# Patient Record
Sex: Male | Born: 1950 | Race: White | Hispanic: No | Marital: Single
Health system: Southern US, Community
[De-identification: ages and names within clinical notes are randomized; demographics above are authoritative.]

---

## 2010-12-14 ENCOUNTER — Encounter: Payer: Self-pay | Admitting: Internal Medicine

## 2011-01-11 ENCOUNTER — Encounter: Payer: Self-pay | Admitting: Internal Medicine

## 2011-02-10 ENCOUNTER — Encounter: Payer: Self-pay | Admitting: Internal Medicine

## 2011-03-13 ENCOUNTER — Encounter: Payer: Self-pay | Admitting: Internal Medicine

## 2011-04-13 ENCOUNTER — Encounter: Payer: Self-pay | Admitting: Internal Medicine

## 2012-10-19 ENCOUNTER — Ambulatory Visit: Payer: Self-pay | Admitting: General Practice

## 2012-10-19 DIAGNOSIS — Z01810 Encounter for preprocedural cardiovascular examination: Secondary | ICD-10-CM

## 2012-10-19 LAB — BASIC METABOLIC PANEL
Anion Gap: 5 — ABNORMAL LOW (ref 7–16)
BUN: 17 mg/dL (ref 7–18)
Calcium, Total: 9 mg/dL (ref 8.5–10.1)
Co2: 30 mmol/L (ref 21–32)
Creatinine: 1.24 mg/dL (ref 0.60–1.30)
EGFR (Non-African Amer.): >60
Glucose: 116 mg/dL — ABNORMAL HIGH (ref 65–99)
Potassium: 3.8 mmol/L (ref 3.5–5.1)
Sodium: 140 mmol/L (ref 136–145)

## 2012-10-19 LAB — URINALYSIS, COMPLETE
Bacteria: NONE SEEN
Blood: NEGATIVE
Glucose,UR: NEGATIVE mg/dL (ref 0–75)
Ketone: NEGATIVE
Leukocyte Esterase: NEGATIVE
Nitrite: NEGATIVE
Ph: 7 (ref 4.5–8.0)
Protein: NEGATIVE
Specific Gravity: 1.003 (ref 1.003–1.030)
WBC UR: 1 /HPF (ref 0–5)

## 2012-10-19 LAB — SEDIMENTATION RATE: Erythrocyte Sed Rate: 6 mm/hr (ref 0–20)

## 2012-10-19 LAB — CBC
HCT: 41.9 % (ref 40.0–52.0)
MCH: 31.5 pg (ref 26.0–34.0)
MCHC: 32 g/dL (ref 32.0–36.0)
MCV: 98 fL (ref 80–100)
WBC: 8.4 10*3/uL (ref 3.8–10.6)

## 2012-10-19 LAB — APTT: Activated PTT: 29.4 secs (ref 23.6–35.9)

## 2012-10-19 LAB — PROTIME-INR: Prothrombin Time: 13.2 secs (ref 11.5–14.7)

## 2012-10-21 LAB — URINE CULTURE

## 2012-11-02 ENCOUNTER — Inpatient Hospital Stay: Payer: Self-pay | Admitting: General Practice

## 2012-11-03 LAB — HEMOGLOBIN: HGB: 13.3 g/dL (ref 13.0–18.0)

## 2012-11-03 LAB — BASIC METABOLIC PANEL
Calcium, Total: 8.3 mg/dL — ABNORMAL LOW (ref 8.5–10.1)
EGFR (African American): >60
EGFR (Non-African Amer.): 59 — ABNORMAL LOW
Osmolality: 282 (ref 275–301)
Potassium: 4.5 mmol/L (ref 3.5–5.1)
Sodium: 141 mmol/L (ref 136–145)

## 2012-11-04 LAB — BASIC METABOLIC PANEL
Anion Gap: 2 — ABNORMAL LOW (ref 7–16)
Chloride: 116 mmol/L — ABNORMAL HIGH (ref 98–107)
Co2: 29 mmol/L (ref 21–32)
Creatinine: 1.06 mg/dL (ref 0.60–1.30)
EGFR (African American): >60
Glucose: 130 mg/dL — ABNORMAL HIGH (ref 65–99)
Osmolality: 293 (ref 275–301)
Potassium: 4.2 mmol/L (ref 3.5–5.1)
Sodium: 147 mmol/L — ABNORMAL HIGH (ref 136–145)

## 2012-11-05 ENCOUNTER — Encounter: Payer: Self-pay | Admitting: Internal Medicine

## 2012-11-10 ENCOUNTER — Encounter: Payer: Self-pay | Admitting: Internal Medicine

## 2012-11-23 ENCOUNTER — Encounter: Payer: Self-pay | Admitting: General Practice

## 2012-12-10 ENCOUNTER — Encounter: Payer: Self-pay | Admitting: Internal Medicine

## 2012-12-10 ENCOUNTER — Encounter: Payer: Self-pay | Admitting: General Practice

## 2014-02-25 IMAGING — CR DG KNEE 1-2V*L*
1 series · 2 of 2 positions shown · non-contrast
Comparison: none

REASON FOR EXAM: postop
COMMENTS:   Bedside (portable):Y

PROCEDURE:     DXR - DXR KNEE LEFT AP AND LATERAL  - November 02, 2012  [DATE]
RESULT:     The patient is status post left knee arthroplasty. Surgical
drains and skin staples are present. There is no immediate postoperative
bone or hardware complication. Anatomic alignment is preserved.

[Series 1: ap · 0.17mm/px · 2 of 2 slices shown]
[im 1/2]
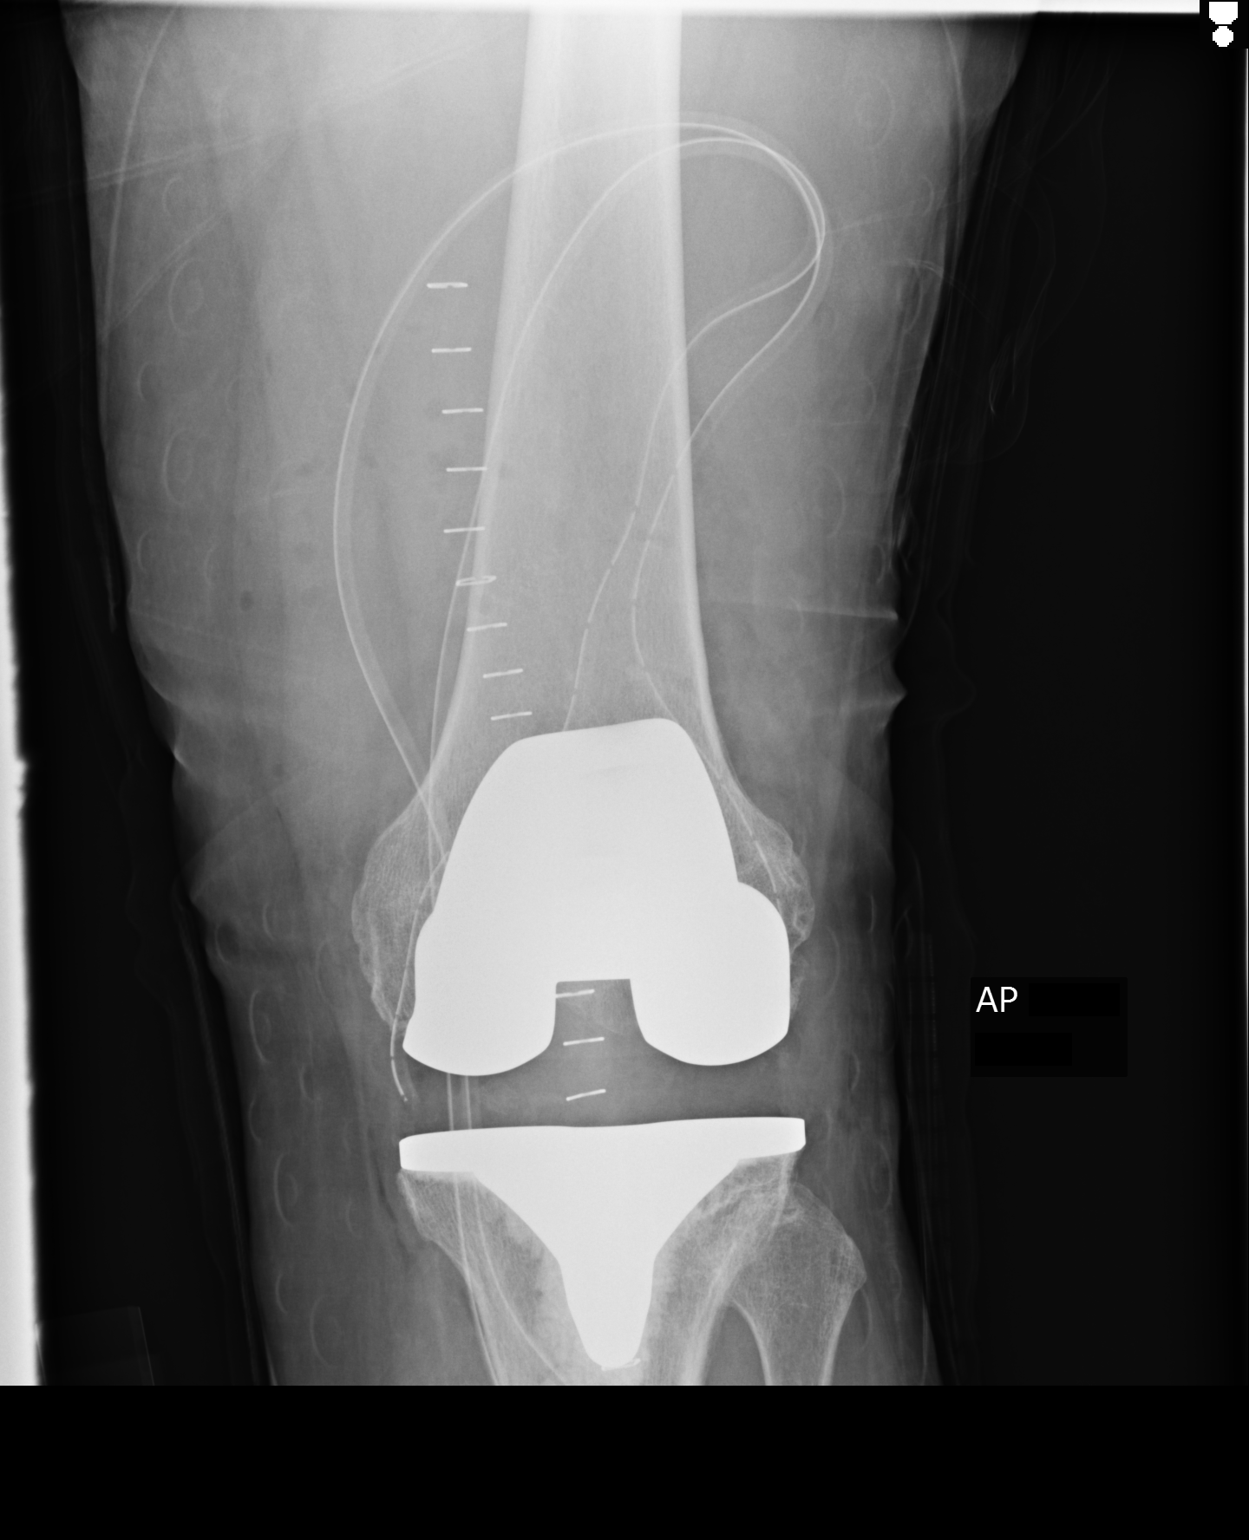
[im 2/2]
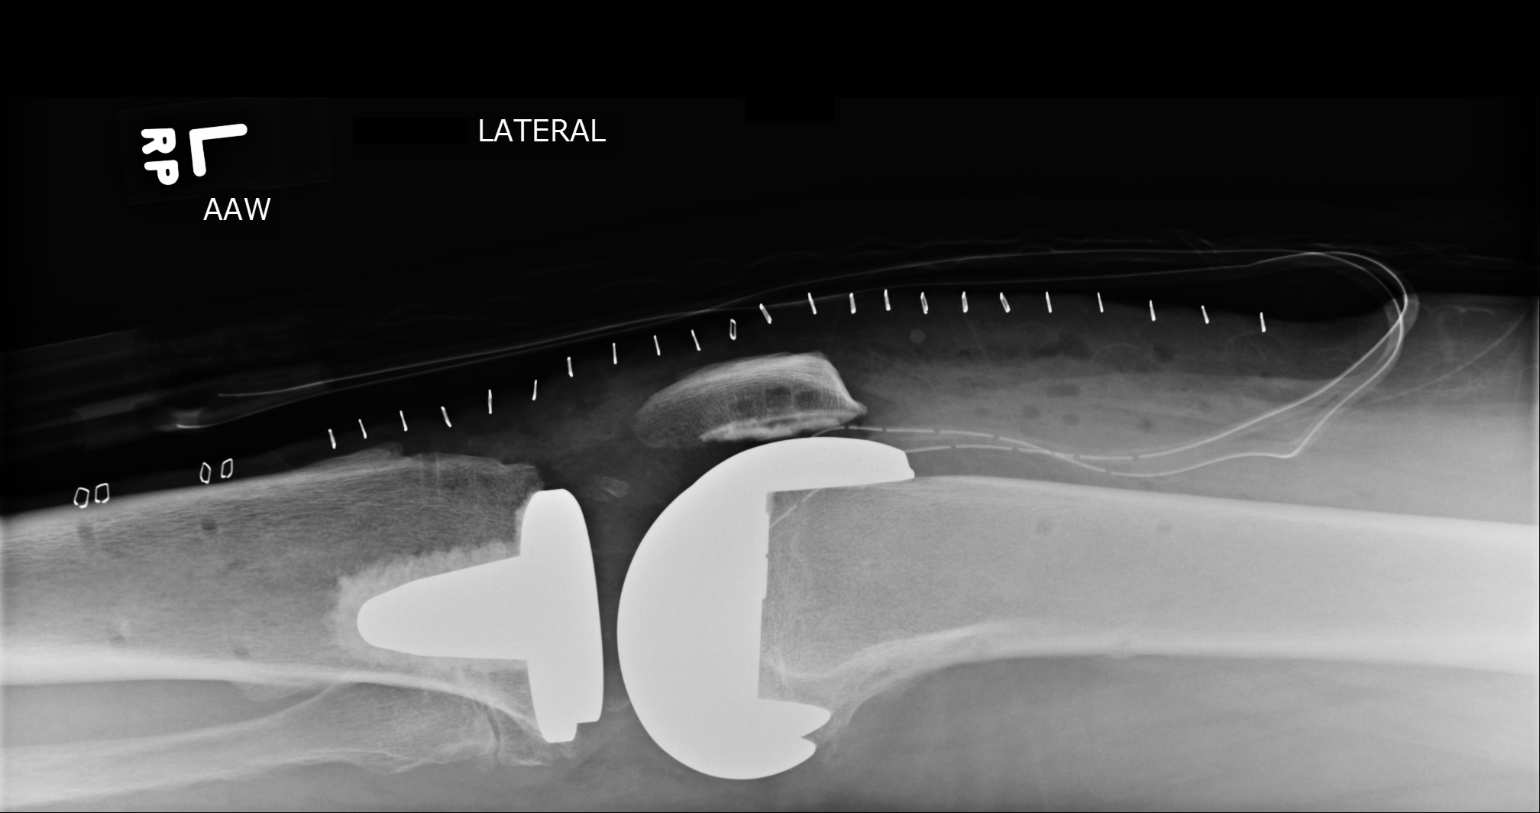

[2 of 2 positions shown; findings below may reference images not displayed]

IMPRESSION: No acute bony abnormality or hardware abnormality in the
immediate postoperative setting.

[REDACTED]

## 2014-12-02 NOTE — Op Note (Signed)
PATIENT NAME:  Cory Short, Cory Short MR#:  [id number] DATE OF BIRTH:  11-27-1950  DATE OF PROCEDURE:  11/02/2012  PREOPERATIVE DIAGNOSIS: Degenerative arthrosis of the left knee.   POSTOPERATIVE DIAGNOSIS: Degenerative arthrosis of the left knee.   PROCEDURE PERFORMED: Left total knee arthroplasty using computer-assisted navigation.   SURGEON: Illene Labrador. Hooten, MD   ASSISTANT:  Van Clines, PA  ANESTHESIA: Femoral nerve block and spinal.   ESTIMATED BLOOD LOSS: 50 mL.   FLUIDS REPLACED: 700 mL of crystalloid.   TOURNIQUET TIME: 87 minutes.   DRAINS: Two medium drains to reinfusion system.   SOFT TISSUE RELEASES: Anterior cruciate ligament, posterior cruciate ligament, deep medial collateral ligament, and patellofemoral ligament.   IMPLANTS UTILIZED: DePuy PFC Sigma size 4 posterior stabilized femoral component (cemented), size 4 MBT tibial component (cemented), 35 mm 3-peg oval dome patella (cemented), and a 15 mm stabilized rotating platform polyethylene insert.   INDICATIONS FOR SURGERY: The patient is a 64 year old male who has been seen for complaints of progressive left knee pain. The patient had not seen any significant improvement despite conservative nonsurgical intervention. X-rays demonstrated severe degenerative changes in tricompartmental fashion with relative varus deformity. After discussion of the risks and benefits of surgical intervention, the patient expressed understanding of the risks and benefits and agreed with plans for surgical intervention.   PROCEDURE IN DETAIL: The patient was brought to the operating room and, after adequate femoral nerve block and spinal anesthesia was achieved, a tourniquet was placed on the patient's upper left thigh. The patient's left knee and leg were cleaned and prepped with alcohol and DuraPrep and draped in the usual sterile fashion. A "timeout" was performed as per usual protocol. The left lower extremity was exsanguinated using an  Esmarch, and the tourniquet was inflated to 300 mmHg. An anterior longitudinal incision was made followed by a standard mid vastus approach. The deep fibers of the medial collateral ligament were elevated in subperiosteal fashion off the medial flare of the tibia so as to maintain a continuous soft tissue sleeve. The patella was subluxed laterally and the patellofemoral ligament was incised. Inspection of the knee demonstrated severe degenerative changes with evidence of full thickness cartilage loss most notably to the medial compartment. Gross irregularity of the remaining articular cartilage was appreciated. Prominent osteophytes were debrided using a rongeur. Anterior and posterior cruciate ligaments were excised. Two 4.0 mm Schanz pins were inserted into the femur and into the tibia for attachment of the array of trackers used for computer-assisted navigation. Hip center was identified using the circumduction technique. Distal landmarks were mapped using the computer. The distal femur and proximal tibia were mapped using the computer. Distal femoral cutting guide was positioned using computer-assisted navigation so as to achieve a 5 degree distal valgus cut. Cut was performed and verified using the computer. Distal femur was sized and it was felt that a size 4 femoral component was appropriate. A size 4 cutting guide was positioned and the anterior cut was performed and verified using the computer. This was followed by completion of the posterior and chamfer cuts. Femoral cutting guide for the central box was then positioned and the central box cut was performed.   Attention was then directed to the proximal tibia. Medial and lateral menisci were excised. The extramedullary tibial cutting guide was positioned using computer-assisted navigation so as to achieve 0 degree varus valgus alignment and 0 degree posterior slope. Cut was performed and verified using the computer. The proximal tibia was sized and it  was felt that a size 4 tibial tray was appropriate. Tibial and femoral trials were inserted followed by insertion of first a 10 and subsequently a 12.5 and eventually a 15 mm polyethylene insert. This allowed for excellent mediolateral soft tissue balancing both in full extension and in flexion. Finally, the patella was cut and prepared so as to accommodate a 35 mm 3-peg oval dome patella. The knee was placed through a range of motion and excellent patellar tracking was appreciated.   Femoral trial was removed after debridement of posterior osteophytes. Central post hole for the tibial component was reamed followed by insertion of a keel punch. Tibial trials were then removed. The cut surfaces of bone were irrigated with copious amounts of normal saline with antibiotic solution using pulsatile lavage and then suctioned dry. Polymethyl methacrylate cement with gentamicin was prepared in the usual fashion using a vacuum mixer. Cement was applied to the cut surface of the proximal tibia as well as along the undersurface of a size 4 MBT tibial component. The tibial component was positioned and impacted into place. Excess cement was removed using freer elevators. Cement was then applied to the cut surface of the femur as well as along the posterior flanges of a size 4 posterior stabilized femoral component. Femoral component was positioned and impacted into place. Excess cement was removed using freer elevators. A 15 mm polyethylene trial was inserted and the knee was brought in full extension with steady axial compression applied. Finally, cement was applied to the backside of a 35 mm 3-peg oval dome patella and the patellar component was positioned and patellar clamp applied. Excess cement was removed using freer elevators.   After adequate curing of cement, the tourniquet was deflated after total tourniquet time of 87 minutes. Hemostasis was achieved using electrocautery. The knee was irrigated with copious  amounts of normal saline with antibiotic solution using pulsatile lavage and then suctioned dry. The knee was inspected for any residual cement debris. 30 mL of 0.25% Marcaine with epinephrine was injected along the posterior capsule. A 15 mm stabilized rotating platform polyethylene insert was inserted and the knee was placed through a range of motion. Excellent patellar tracking was appreciated and excellent mediolateral soft tissue balancing was noted. Two medium drains were placed in the wound bed and brought out through a separate stab incision to be attached to a reinfusion system. The medial parapatellar portion of the incision was reapproximated using interrupted sutures of #1 Vicryl. The subcutaneous tissue was approximated in layers using first #0 Vicryl followed by #2-0 Vicryl. Skin was closed with skin staples. Sterile dressing was applied.   The patient tolerated the procedure well. He was transported to the recovery room in stable condition.  ____________________________ Illene LabradorJames P. Angie FavaHooten Jr., MD jph:sb D: 11/03/2012 06:29:26 ET T: 11/03/2012 07:56:20 ET JOB#: [id number]  cc: Illene LabradorJames P. Angie FavaHooten Jr., MD, <Dictator> JAMES P Angie FavaHOOTEN JR MD ELECTRONICALLY SIGNED 11/07/2012 12:28

## 2014-12-02 NOTE — Discharge Summary (Signed)
PATIENT NAME:  Cory Short, Cory Short MR#:  [id number] DATE OF BIRTH:  [birth year]  DATE OF ADMISSION:  11/02/2012 DATE OF DISCHARGE:  11/05/2012  ADMITTING DIAGNOSIS: Degenerative arthrosis of left knee.   DISCHARGE DIAGNOSIS: Degenerative arthrosis of left knee.   HISTORY: The patient is a pleasant 64 year old male who has been followed at [facility] for progression of left knee pain. He has a relatively long history of left knee pain that dates back to at least 2 years ago. He did not recall any specific trauma or aggravating event. He had been followed by Dr. Wayne BothJohn O'Malley at [place]. He had received intraarticular cortisone injections, Synvisc injections, as well as topical antiinflammatory medications without any significant improvement in his pain. He had localized most of the pain along the medial aspect of the knee. His pain was aggravated with weight-bearing activities. On occasion the patient had noted some near giving way of the knee, but denied any gross locking. His pain had increased to the point that it was significantly interfering with his activities of daily living. [facility] showed narrowing of the medial cartilage space with associated varus alignment. He was noted to have osteophyte as well as subchondral sclerosis and degenerative changes to the patellofemoral articulation. After discussion of the risks and benefits of surgical intervention, the patient expressed his understanding of the risks and benefits and agreed with plans for surgical intervention.   PROCEDURE: Left total knee arthroplasty using computer-assisted navigation.   ANESTHESIA: Femoral nerve block with spinal.   SOFT TISSUE RELEASE: Anterior cruciate ligament, posterior cruciate ligament, deep medial collateral ligaments, as well as the patellofemoral ligament.   IMPLANTS UTILIZED: DePuy PFC Sigma size 4 posterior stabilized femoral component (cemented), size 4 MBT tibial  component (cemented), 35 mm 3-pegged oval dome patella (cemented), and a 10 mm stabilized rotating platform polyethylene insert.   HOSPITAL COURSE: The patient tolerated the procedure very well. He had no complications. He was then taken to PAC-U where he was stabilized and then transferred to the orthopedic floor. He began receiving anticoagulation therapy of Lovenox 30 mg sub-Q every 12 hours per anesthesia and pharmacy protocol. The patient was fitted with TED stockings bilaterally. These were allowed to be removed 1 hour per 8 hour shift. The left one was applied on day 2 following removal of the Hemovac and dressing change. The patient was also fitted with AV-I compression foot pumps bilaterally set at 80 mmHg. There has been no evidence of any DVTs to the lower extremity. Calves have been nontender. Negative Homans sign. Heels were elevated off the bed using rolled towels.   The patient has denied any chest pain or shortness of breath. Vital signs have been stable. He has been afebrile. Hemodynamically he was stable and no transfusions were given other than the Autovac transfusions given the first 6 hours postoperatively.   Physical therapy was initiated on day 1 for gait training and transfers. He has done extremely well. Upon being discharged, he was ambulating greater than 200 feet. He was able to go up and down 4 sets of steps. He was independent with bed to chair transfers. Occupational therapy was also initiated on day 1 for ADL and assistive devices. The patient has progressed very nicely.   The patient's IV, Foley and Hemovac were discontinued on day 2 along with a dressing change. The wound was free of any drainage or signs of infection. The Polar Care was reapplied to the surgical leg maintaining a  temperature of 40 to 50 degrees Fahrenheit.   DISPOSITION: The patient is being discharged to [facility] in improved stable condition.   DISCHARGE INSTRUCTIONS:   1.  He is to continue  weight-bearing as tolerated. Continue PT for gait training and transfers. Continue using a rolling walker until cleared by physical therapy to go to a quad cane. He is also to receive occupational therapy as needed. This is for ADL and assistive devices.  2.  Continue TED stockings. These are to be removed 1 hour per 8 hour shift.  3.  Continue Polar Care maintaining a temperature of 40 to 50 degrees Fahrenheit to the surgical leg.  4.  Regular diet.  5.  Follow up in the clinic April 8th at 9:15.  6.  Call if any increased bleeding or fevers greater than 101.5.  7.  Incentive spirometer q. 1 hour while awake.  8.  Encourage cough and deep breathing every 2 hours while awake.  9.  He is placed on a regular diet.   DRUG ALLERGIES: No known drug allergies.   MEDICATIONS:  1.  Alprazolam 0.5 mg b.i.d.  2.  Vitamin D3 5000 units daily.  3.  Senokot-S 1 tablet b.i.d. 4.  Lamotrigine 150 mg daily since. 5.  Synthroid 0.05 mg q. 6 a.m.  6.  Lithium carbonate 600 mg b.i.d.  7.  Pantoprazole 40 mg b.i.d.  8.  Lovenox 30 mg sub-Q every 12 hours for 14 days then discontinue and begin taking one 81 mg enteric-coated aspirin. 9.  Tylenol ES 500 to 1000 mg q. 4 hours p.r.n. for pain or fevers of greater than 100.4. 10.  Norco 5/325 mg 1 to 2 tablets q. 4 to 6 hours p.r.n. for pain.  11.  Mylanta DS 30 mL q. 6 hours p.r.n.  12.  Dulcolax suppository 10 mg rectally daily p.r.n.  13.  Milk of magnesia 30 mL b.i.d. p.r.n.  14.  Tramadol 50 to 100 mg q. 4 hours p.r.n. for pain.  15.  Ambien 5 mg at bedtime p.r.n.  16.  Enema soapsuds if no results with milk of magnesia or Dulcolax.   PAST MEDICAL HISTORY: 1.  Chickenpox.  2.  Shingles. 3.  Laryngeal cancer. 4.  Bipolar. 5.  Hypothyroidism. ____________________________ Van Clines, PA [email or web address] D: 11/05/2012 08:06:53 ET T: 11/05/2012 08:42:54 ET JOB#: [id number]  cc: Van Clines, PA, <Dictator> JON WOLFE PA ELECTRONICALLY SIGNED 11/07/2012 8:02

## 2017-02-09 DEATH — deceased
